# Patient Record
Sex: Male | Born: 2000 | Race: Black or African American | Hispanic: No | Marital: Single | Smoking: Never smoker
Health system: Southern US, Community
[De-identification: ages and names within clinical notes are randomized; demographics above are authoritative.]

---
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
## 2022-03-19 IMAGING — CR DG KNEE COMPLETE 4+V*R*
1 series · 4 of 4 positions shown · non-contrast
Comparison: None.

CLINICAL DATA: Knee pain

EXAM:
RIGHT KNEE - COMPLETE 4+ VIEW

[Series 1: dg knee 4 v w/ sunrise/patella right · 0.14mm/px · 4 of 4 slices shown]
[im 1/4]
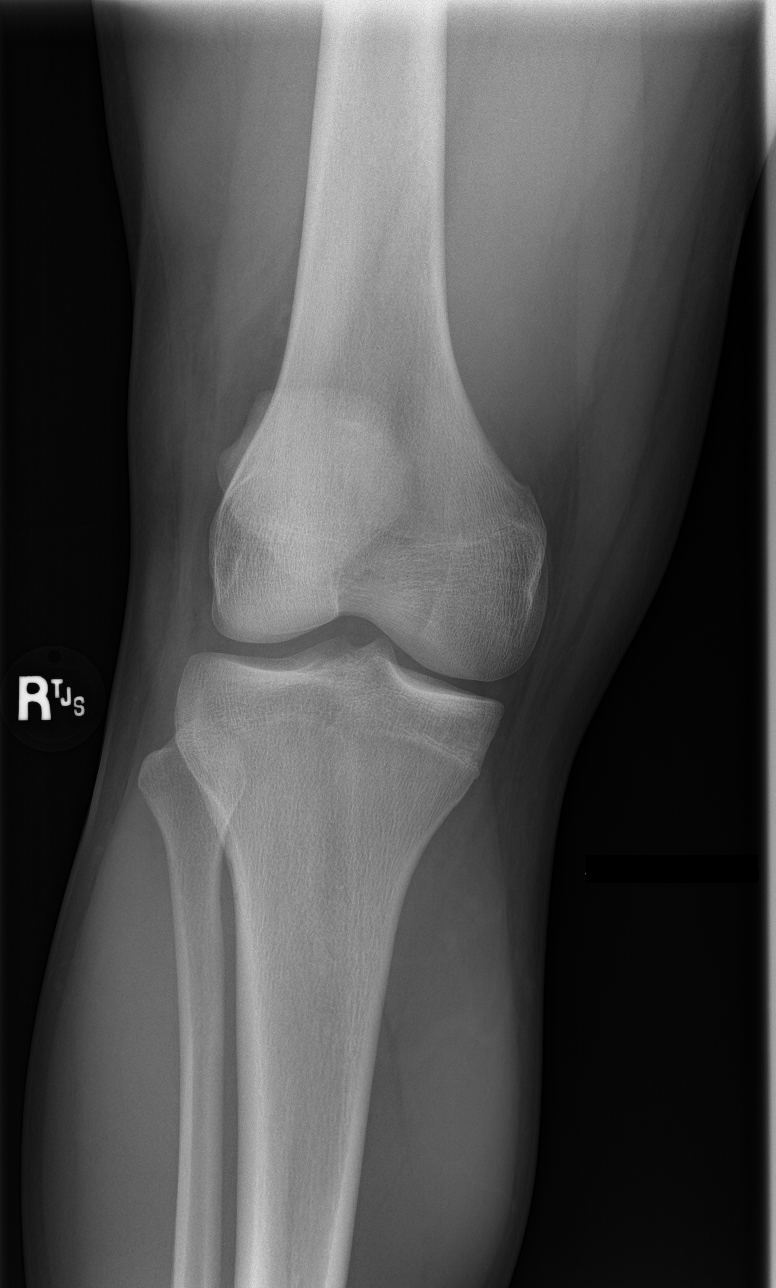
[im 2/4]
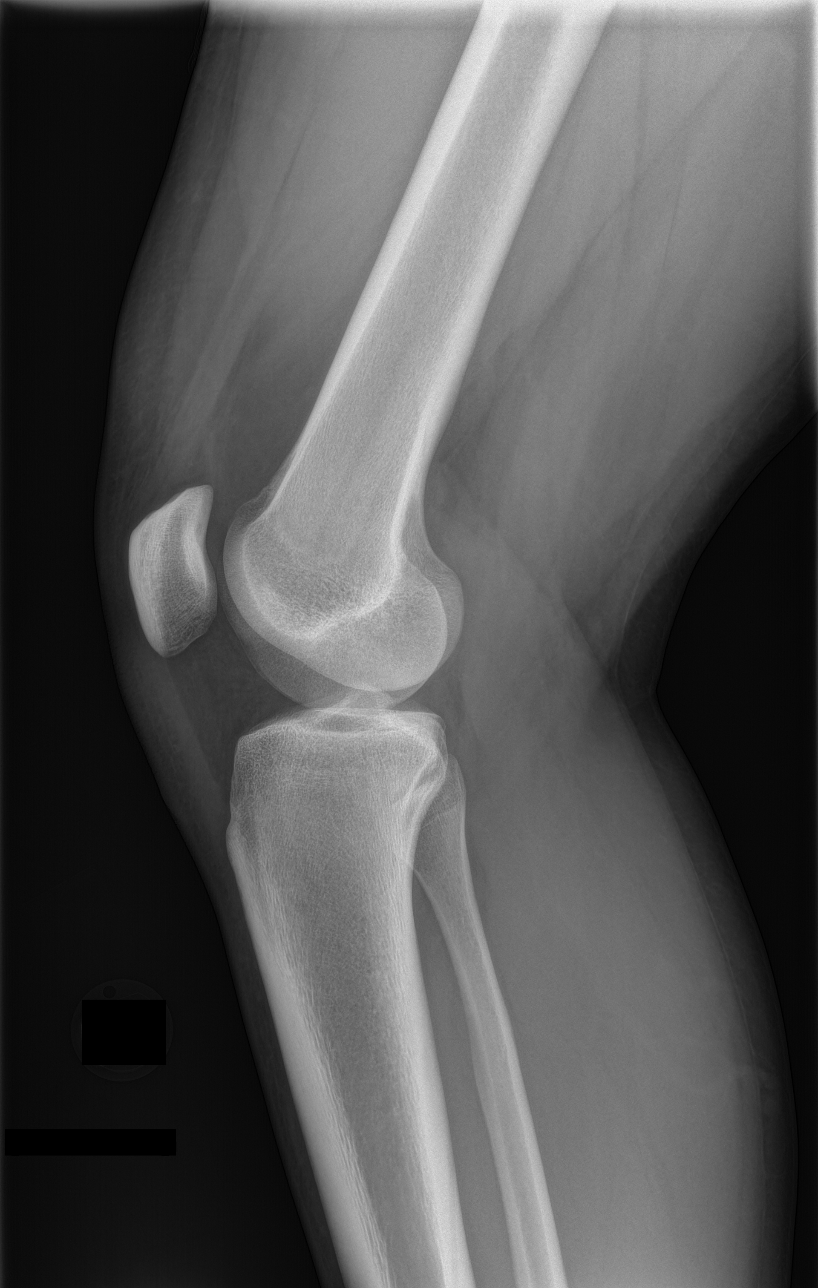
[im 3/4]
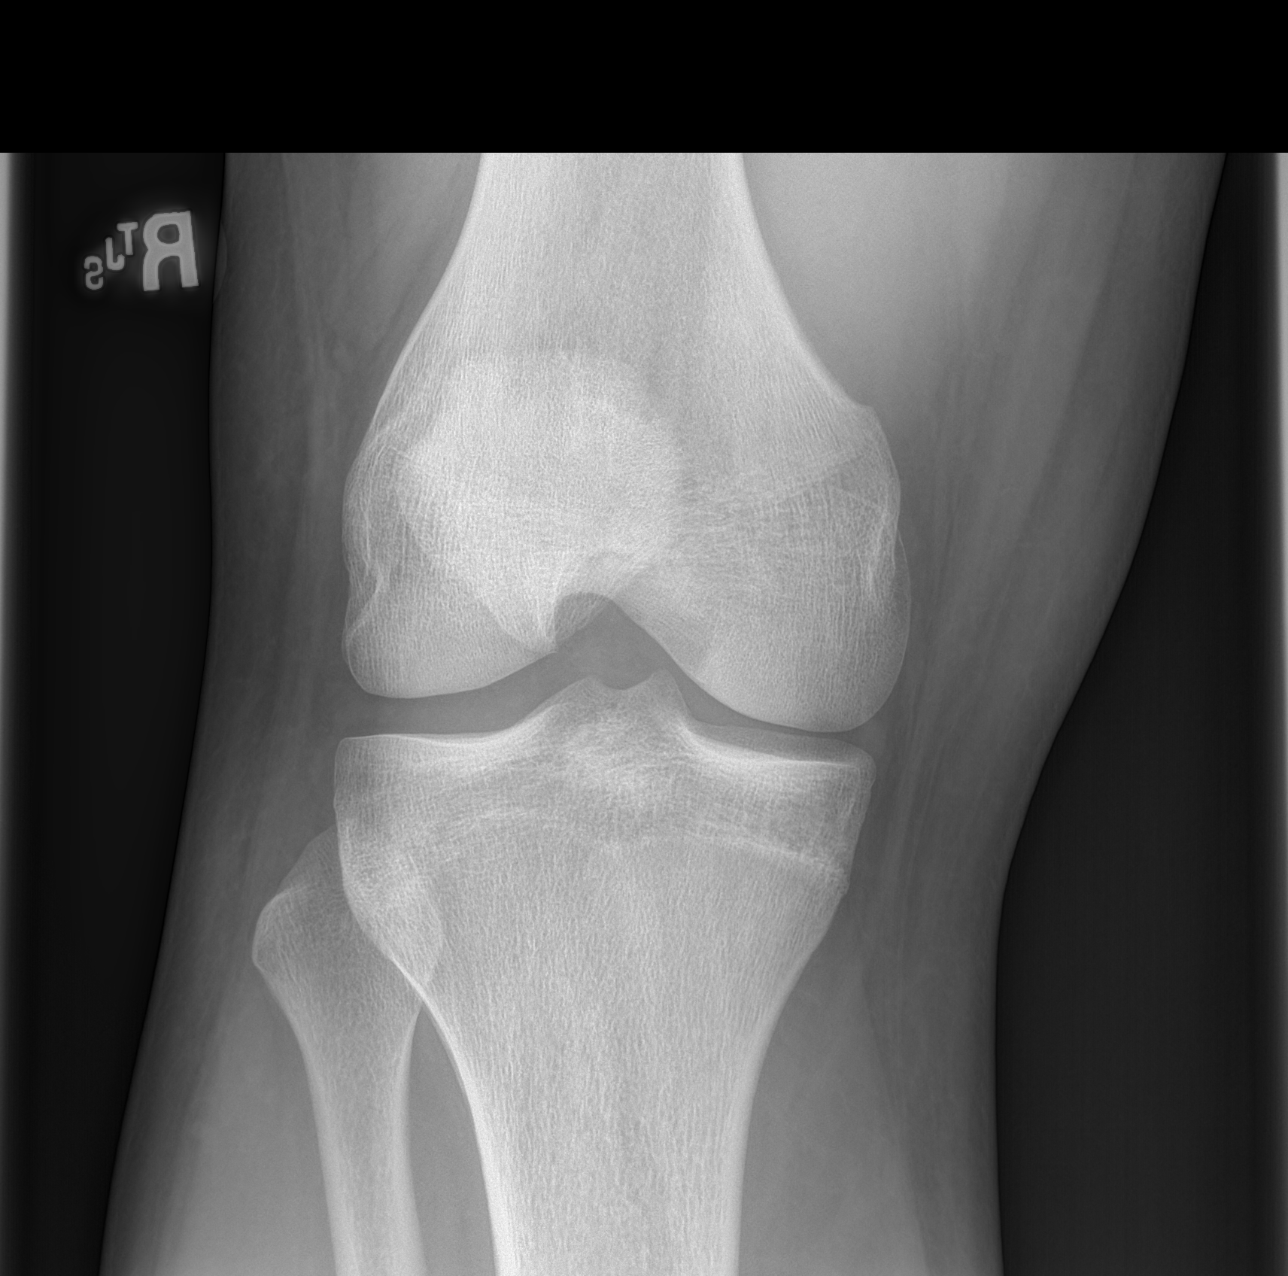
[im 4/4]
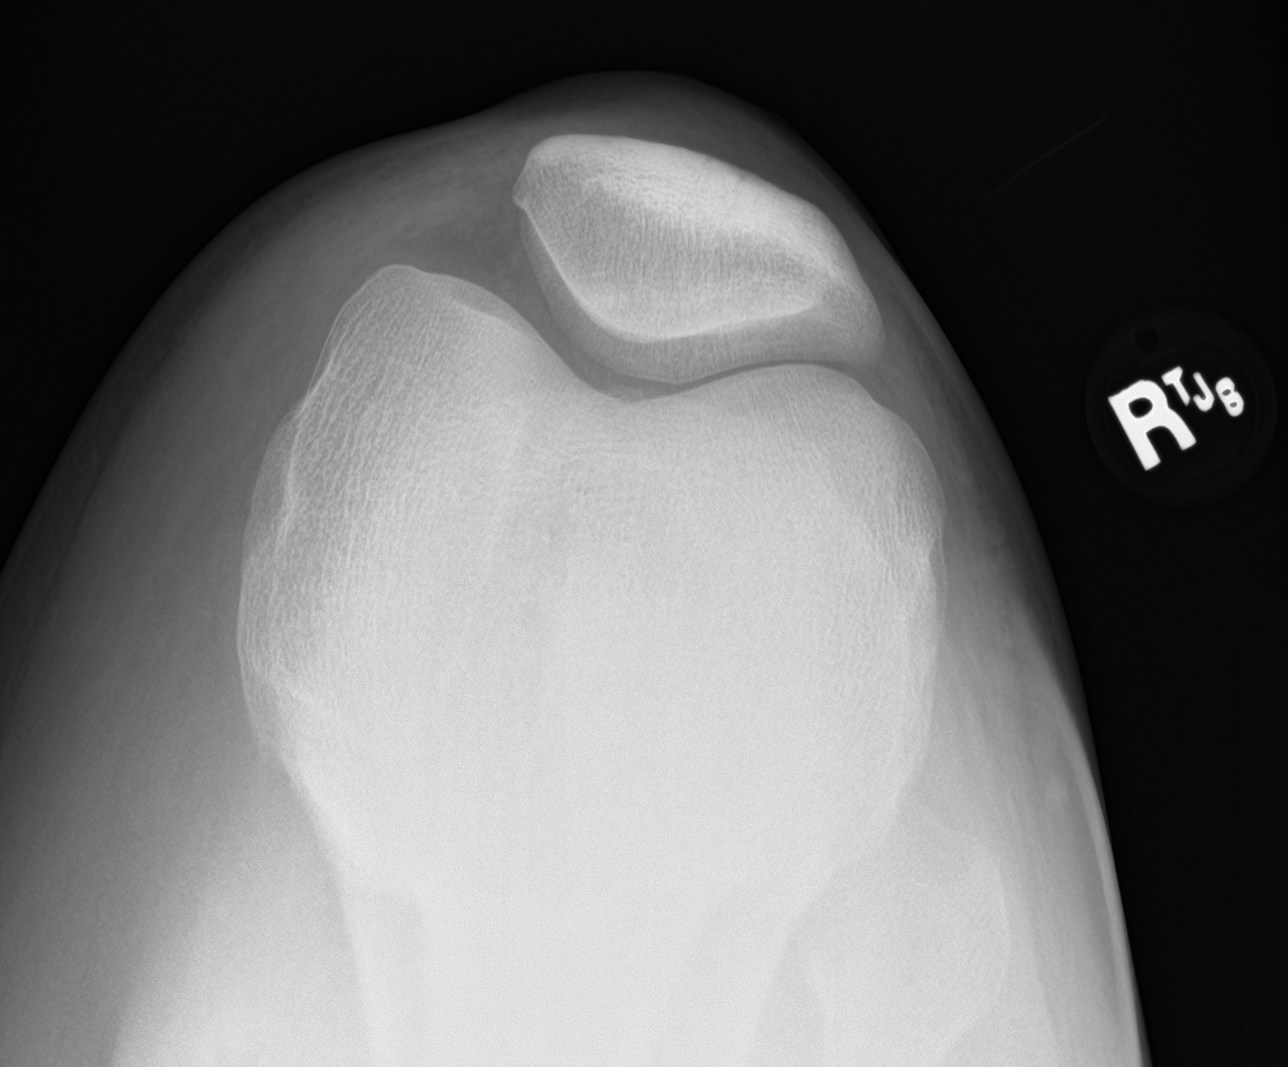

[4 of 4 positions shown; findings below may reference images not displayed]

FINDINGS: No evidence of fracture, dislocation, or joint effusion. No evidence
of arthropathy or other focal bone abnormality. Soft tissues are
unremarkable.
IMPRESSION: Negative.

## 2022-06-05 ENCOUNTER — Other Ambulatory Visit: Payer: Self-pay

## 2022-06-05 ENCOUNTER — Encounter: Payer: Self-pay | Admitting: Oncology

## 2022-06-05 ENCOUNTER — Ambulatory Visit (INDEPENDENT_AMBULATORY_CARE_PROVIDER_SITE_OTHER): Payer: 59 | Admitting: Oncology

## 2022-06-05 VITALS — BP 120/60 | HR 106 | Temp 99.5°F | Resp 18 | Ht 73.0 in | Wt 256.0 lb

## 2022-06-05 DIAGNOSIS — J029 Acute pharyngitis, unspecified: Secondary | ICD-10-CM

## 2022-06-05 LAB — POC SOFIA 2 FLU + SARS ANTIGEN FIA
Influenza A, POC: NEGATIVE
Influenza B, POC: NEGATIVE
SARS Coronavirus 2 Ag: NEGATIVE

## 2022-06-05 MED ORDER — OSELTAMIVIR PHOSPHATE 75 MG PO CAPS: 75.0000 mg | ORAL_CAPSULE | Freq: Two times a day (BID) | ORAL | 0 refills | Status: AC

## 2022-06-05 NOTE — Progress Notes (Signed)
[address] [place], [place] [address] Phone: [phone] Fax: [phone]   Office Visit Note  Patient Name: Matthew Estrada  Date of [id number]  Med Rec number [id number]  Date of Service: 06/05/2022  Patient has no known allergies.  Chief Complaint  Patient presents with   Other   Patient is an 22 y.o. student here for complaints of  headache, sore throat that started yesterday. Went outside to run and started feeling woozy and fatigued. Sx slightly worsened this morning. Took Tylenol around 1130 pm that is football trainer gave him. Able to eat and drink well.  Athletic trainer recommended he be seen and tested for flu and COVID.   Did not have the flu shot this year.   Current Medication:  No outpatient encounter medications on file as of 06/05/2022.   No facility-administered encounter medications on file as of 06/05/2022.   Medical History: History reviewed. No pertinent past medical history.  Vital Signs: BP 120/60   Pulse (!) 106   Temp 99.5 F (37.5 C) (Tympanic)   Resp 18   Ht 6\' 1"  (1.854 m)   Wt 256 lb (116.1 kg)   SpO2 99%   BMI 33.78 kg/m   ROS: As per HPI.  All other pertinent ROS negative.     Review of Systems  Constitutional:  Positive for fatigue.  HENT:  Positive for postnasal drip and sore throat.   Respiratory:  Positive for cough.   Gastrointestinal:  Negative for diarrhea, nausea and vomiting.  Musculoskeletal:  Positive for myalgias.  Neurological:  Positive for headaches. Negative for dizziness.    Physical Exam Vitals reviewed.  Constitutional:      Appearance: Normal appearance.  HENT:     Right Ear: Tympanic membrane normal.     Left Ear: Tympanic membrane normal.     Nose:     Right Turbinates: Swollen.     Left Turbinates: Swollen.     Right Sinus: No maxillary sinus tenderness or frontal sinus tenderness.     Left Sinus: No maxillary sinus tenderness.  Cardiovascular:     Rate and Rhythm:  Normal rate and regular rhythm.  Pulmonary:     Effort: Pulmonary effort is normal.     Breath sounds: Normal breath sounds.  Lymphadenopathy:     Cervical: No cervical adenopathy.  Neurological:     Mental Status: He is alert.     No results found for this or any previous visit (from the past 24 hour(s)).  Assessment/Plan: 1. Sore throat -COVID and flu negative. -Discussed testing may be a little early to be positive.  Recommend sending Tamiflu 75 mg twice daily to pharmacy should his symptoms worsen tomorrow.  Discussed how and when to take and side effects.  If your symptoms improve, you do not need to pick up this medication.  If symptoms worsen, recommend picking up Tamiflu, Flonase 2 sprays each nostril daily, decongestant such as Sudafed/Mucinex if your symptoms improve, you do not need to pick up tamiflu. -Sx should start improving over the next 2-3 days.   - POC SOFIA 2 FLU + SARS ANTIGEN FIA   Disposition-return to clinic as needed.  General Counseling: [name] verbalizes understanding of the findings of todays visit and agrees with plan of treatment. I have discussed any further diagnostic evaluation that may be needed or ordered today. We also reviewed his medications today. he has been encouraged to call the office with any questions or concerns that should arise  related to todays visit.   No orders of the defined types were placed in this encounter.   No orders of the defined types were placed in this encounter.   I spent 20 minutes dedicated to the care of this patient (face-to-face and non-face-to-face) on the date of the encounter to include what is described in the assessment and plan.   Faythe Casa, NP 06/05/2022 2:19 PM
